# Patient Record
Sex: Male | Born: 1995 | Hispanic: Yes | Marital: Single | Smoking: Never smoker
Health system: Southern US, Community
[De-identification: ages and names within clinical notes are randomized; demographics above are authoritative.]

## PROBLEM LIST (undated history)

## (undated) HISTORY — PX: APPENDECTOMY: SHX54

---

## 2018-05-04 ENCOUNTER — Emergency Department: Payer: No Typology Code available for payment source

## 2018-05-04 ENCOUNTER — Emergency Department
Admission: EM | Admit: 2018-05-04 | Discharge: 2018-05-04 | Disposition: A | Discharge status: Other Acute Inpatient Hospital | Payer: No Typology Code available for payment source | Attending: Student in an Organized Health Care Education/Training Program | Admitting: Student in an Organized Health Care Education/Training Program

## 2018-05-04 ENCOUNTER — Encounter: Payer: Self-pay | Admitting: Emergency Medicine

## 2018-05-04 ENCOUNTER — Other Ambulatory Visit: Payer: Self-pay

## 2018-05-04 DIAGNOSIS — Y939 Activity, unspecified: Secondary | ICD-10-CM | POA: Diagnosis not present

## 2018-05-04 DIAGNOSIS — S27321A Contusion of lung, unilateral, initial encounter: Secondary | ICD-10-CM | POA: Diagnosis not present

## 2018-05-04 DIAGNOSIS — Y929 Unspecified place or not applicable: Secondary | ICD-10-CM | POA: Insufficient documentation

## 2018-05-04 DIAGNOSIS — R002 Palpitations: Secondary | ICD-10-CM | POA: Diagnosis present

## 2018-05-04 DIAGNOSIS — Z79899 Other long term (current) drug therapy: Secondary | ICD-10-CM | POA: Insufficient documentation

## 2018-05-04 DIAGNOSIS — R079 Chest pain, unspecified: Secondary | ICD-10-CM | POA: Insufficient documentation

## 2018-05-04 DIAGNOSIS — S3991XA Unspecified injury of abdomen, initial encounter: Secondary | ICD-10-CM | POA: Diagnosis not present

## 2018-05-04 DIAGNOSIS — Y999 Unspecified external cause status: Secondary | ICD-10-CM | POA: Diagnosis not present

## 2018-05-04 LAB — TYPE AND SCREEN
ABO/RH(D): O POS
ANTIBODY SCREEN: NEGATIVE

## 2018-05-04 LAB — CBC WITH DIFFERENTIAL/PLATELET
Basophils Absolute: 0 10*3/uL (ref 0–0.1)
Basophils Relative: 0 %
EOS PCT: 0 %
Eosinophils Absolute: 0 10*3/uL (ref 0–0.7)
HCT: 41.4 % (ref 40.0–52.0)
Hemoglobin: 14.3 g/dL (ref 13.0–18.0)
LYMPHS ABS: 0.7 10*3/uL — AB (ref 1.0–3.6)
LYMPHS PCT: 4 %
MCH: 30.5 pg (ref 26.0–34.0)
MCHC: 34.6 g/dL (ref 32.0–36.0)
MCV: 88 fL (ref 80.0–100.0)
MONO ABS: 0.8 10*3/uL (ref 0.2–1.0)
Monocytes Relative: 5 %
Neutro Abs: 14.4 10*3/uL — ABNORMAL HIGH (ref 1.4–6.5)
Neutrophils Relative %: 91 %
PLATELETS: 179 10*3/uL (ref 150–440)
RBC: 4.71 MIL/uL (ref 4.40–5.90)
RDW: 14.1 % (ref 11.5–14.5)
WBC: 16 10*3/uL — ABNORMAL HIGH (ref 3.8–10.6)

## 2018-05-04 LAB — BASIC METABOLIC PANEL
Anion gap: 13 (ref 5–15)
BUN: 13 mg/dL (ref 6–20)
CALCIUM: 8.7 mg/dL — AB (ref 8.9–10.3)
CO2: 25 mmol/L (ref 22–32)
Chloride: 102 mmol/L (ref 98–111)
Creatinine, Ser: 0.97 mg/dL (ref 0.61–1.24)
GFR calc Af Amer: >60 mL/min (ref >60)
GLUCOSE: 133 mg/dL — AB (ref 70–99)
POTASSIUM: 4.1 mmol/L (ref 3.5–5.1)
Sodium: 140 mmol/L (ref 135–145)

## 2018-05-04 LAB — FIBRIN DERIVATIVES D-DIMER (ARMC ONLY): Fibrin derivatives D-dimer (ARMC): 922.99 ng/mL (FEU) — ABNORMAL HIGH (ref 0.00–499.00)

## 2018-05-04 LAB — TROPONIN I: Troponin I: <0.03 ng/mL (ref <0.03)

## 2018-05-04 MED ORDER — AMOXICILLIN 500 MG PO TABS: 1000.0000 mg | ORAL_TABLET | Freq: Two times a day (BID) | ORAL | 0 refills | Status: AC

## 2018-05-04 MED ORDER — LORAZEPAM 1 MG PO TABS
1.0000 mg | ORAL_TABLET | Freq: Once | ORAL | Status: AC
  Administered 2018-05-04: 1 mg via ORAL
  Filled 2018-05-04: qty 1

## 2018-05-04 MED ORDER — FAMOTIDINE 20 MG/2ML IV SOLN
20.00 | INTRAVENOUS | Status: DC
Start: 2018-05-05 — End: 2018-05-04

## 2018-05-04 MED ORDER — ONDANSETRON HCL 4 MG/2ML IJ SOLN
4.00 | INTRAMUSCULAR | Status: DC
Start: ? — End: 2018-05-04

## 2018-05-04 MED ORDER — ALBUTEROL SULFATE HFA 108 (90 BASE) MCG/ACT IN AERS: 2.0000 | INHALATION_SPRAY | Freq: Four times a day (QID) | RESPIRATORY_TRACT | 2 refills | Status: AC | PRN

## 2018-05-04 MED ORDER — POTASSIUM CHLORIDE 10 MEQ/100ML IV SOLN
10.00 | INTRAVENOUS | Status: DC
Start: ? — End: 2018-05-04

## 2018-05-04 MED ORDER — SODIUM CHLORIDE 0.9 % IV BOLUS
1000.0000 mL | Freq: Once | INTRAVENOUS | Status: AC
  Administered 2018-05-04: 1000 mL via INTRAVENOUS

## 2018-05-04 MED ORDER — NALOXONE HCL 0.4 MG/ML IJ SOLN
0.40 | INTRAMUSCULAR | Status: DC
Start: ? — End: 2018-05-04

## 2018-05-04 MED ORDER — ALBUTEROL SULFATE (2.5 MG/3ML) 0.083% IN NEBU
2.50 | INHALATION_SOLUTION | RESPIRATORY_TRACT | Status: DC
Start: ? — End: 2018-05-04

## 2018-05-04 MED ORDER — DOCUSATE SODIUM 100 MG PO CAPS
100.00 | ORAL_CAPSULE | ORAL | Status: DC
Start: 2018-05-05 — End: 2018-05-04

## 2018-05-04 MED ORDER — MAGNESIUM SULFATE 2 GM/50ML IV SOLN
2.00 | INTRAVENOUS | Status: DC
Start: ? — End: 2018-05-04

## 2018-05-04 MED ORDER — IOHEXOL 300 MG/ML  SOLN
75.0000 mL | Freq: Once | INTRAMUSCULAR | Status: AC | PRN
  Administered 2018-05-04: 75 mL via INTRAVENOUS

## 2018-05-04 MED ORDER — GENERIC EXTERNAL MEDICATION
1.00 | Status: DC
Start: ? — End: 2018-05-04

## 2018-05-04 MED ORDER — LACTATED RINGERS IV SOLN
100.00 | INTRAVENOUS | Status: DC
Start: ? — End: 2018-05-04

## 2018-05-04 MED ORDER — GENERIC EXTERNAL MEDICATION
15.00 | Status: DC
Start: ? — End: 2018-05-04

## 2018-05-04 MED ORDER — AZITHROMYCIN 250 MG PO TABS: ORAL_TABLET | ORAL | 0 refills | Status: AC

## 2018-05-04 MED ORDER — ACETAMINOPHEN 325 MG PO TABS
650.00 | ORAL_TABLET | ORAL | Status: DC
Start: 2018-05-05 — End: 2018-05-04

## 2018-05-04 MED ORDER — TRANEXAMIC ACID 1000 MG/10ML IV SOLN
1000.0000 mg | Freq: Once | INTRAVENOUS | Status: AC
  Administered 2018-05-04: 1000 mg via INTRAVENOUS
  Filled 2018-05-04: qty 10

## 2018-05-04 MED ORDER — GENERIC EXTERNAL MEDICATION
Status: DC
Start: ? — End: 2018-05-04

## 2018-05-04 MED ORDER — POLYETHYLENE GLYCOL 3350 17 G PO PACK
17.00 | PACK | ORAL | Status: DC
Start: 2018-05-06 — End: 2018-05-04

## 2018-05-04 MED ORDER — IOHEXOL 350 MG/ML SOLN
75.0000 mL | Freq: Once | INTRAVENOUS | Status: AC | PRN
  Administered 2018-05-04: 75 mL via INTRAVENOUS

## 2018-05-04 MED ORDER — TRANEXAMIC ACID 1000 MG/10ML IV SOLN
1000.0000 mg | Freq: Once | INTRAVENOUS | Status: DC
  Filled 2018-05-04: qty 10
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
## 2018-05-04 NOTE — ED Triage Notes (Addendum)
C/O  Palpitations x 3 hours.  States he has chest pain when laying down.  Has had one previous episode of same about 3 years ago, but states "He was able to cool down his heart on his own after 10 minutes"  Patient is AAOx3.  Skin warm and dry. Very anxious.
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
## 2018-05-04 NOTE — ED Notes (Signed)
emtala reviewed by this RN 
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
## 2018-05-05 MED ORDER — ENOXAPARIN SODIUM 30 MG/0.3ML ~~LOC~~ SOLN
30.00 | SUBCUTANEOUS | Status: DC
Start: 2018-05-05 — End: 2018-05-05

## 2018-05-05 MED ORDER — OXYCODONE HCL 5 MG PO TABS
5.00 | ORAL_TABLET | ORAL | Status: DC
Start: ? — End: 2018-05-05

## 2019-03-19 IMAGING — CT CT ANGIO CHEST
2 of 6 series · 19 of 36 positions shown · IV contrast (APPLIED)
Comparison: Current chest radiographs.

CLINICAL DATA: C/O Palpitations x 3 hours. States he has chest pain
when laying down. Has had one previous episode of same about 3 years
ago, but states "He was able to cool down his heart on his own after
10 minutes"

EXAM:
CT ANGIOGRAPHY CHEST WITH CONTRAST
TECHNIQUE: Multidetector CT imaging of the chest was performed using the
standard protocol during bolus administration of intravenous
contrast. Multiplanar CT image reconstructions and MIPs were
obtained to evaluate the vascular anatomy.
CONTRAST:  75mL OMNIPAQUE IOHEXOL 350 MG/ML SOLN

[Series 6: thins · axial · 0.65mm/px · z∈[-28,+248]mm · 18 of 309 slices shown]
[im 16/309  lung]
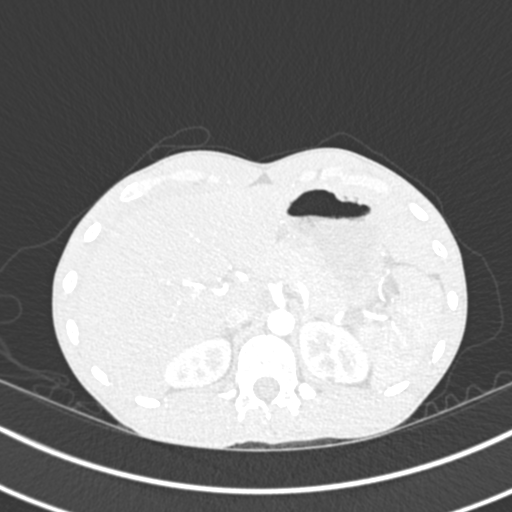
[im 31/309  mediastinal]
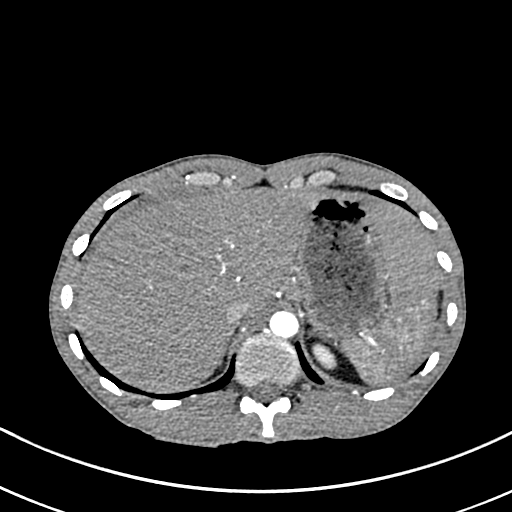
[im 47/309  lung]
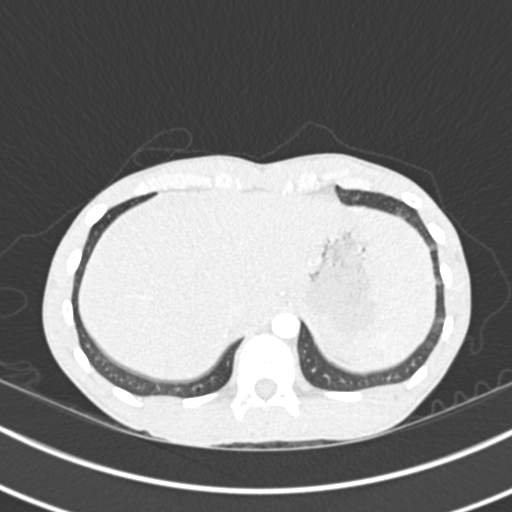
[im 62/309  mediastinal]
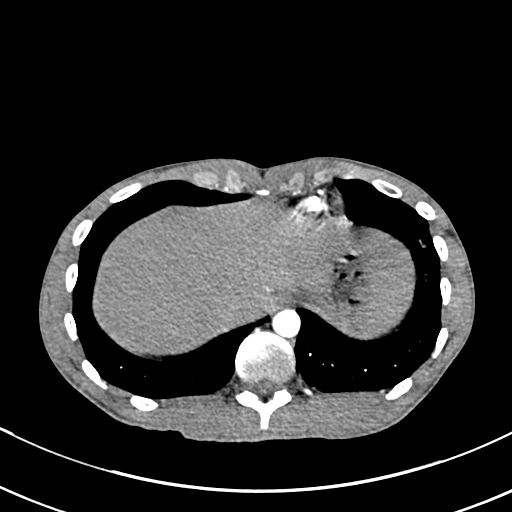
[im 78/309  lung]
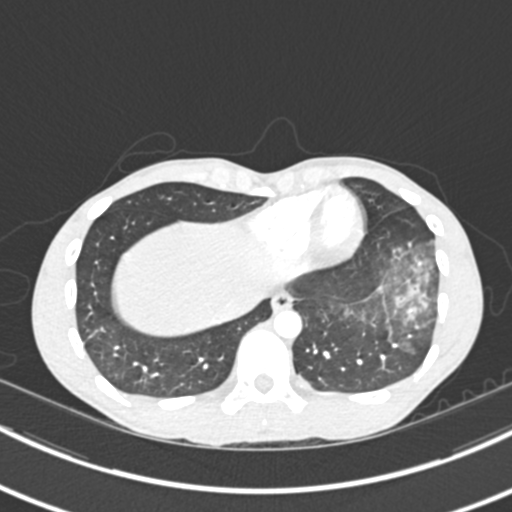
[im 93/309  mediastinal]
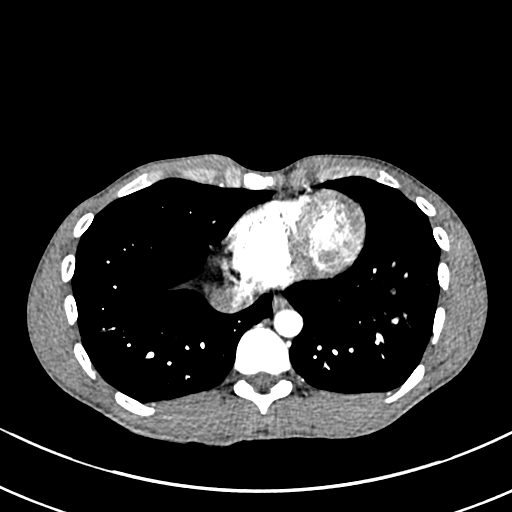
[im 108/309  lung]
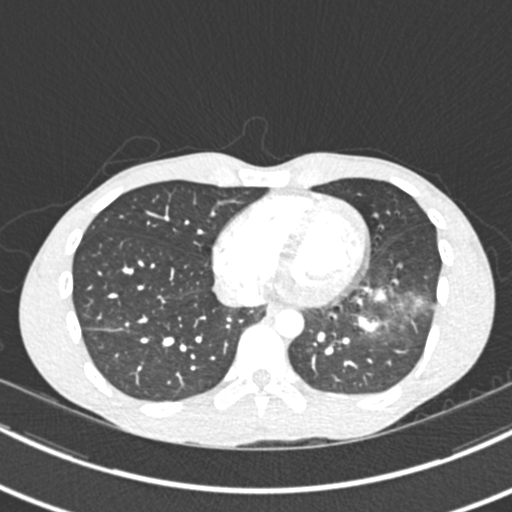
[im 124/309  mediastinal]
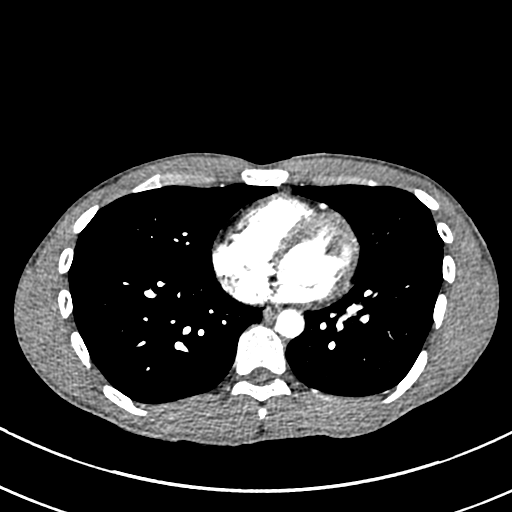
[im 139/309  lung]
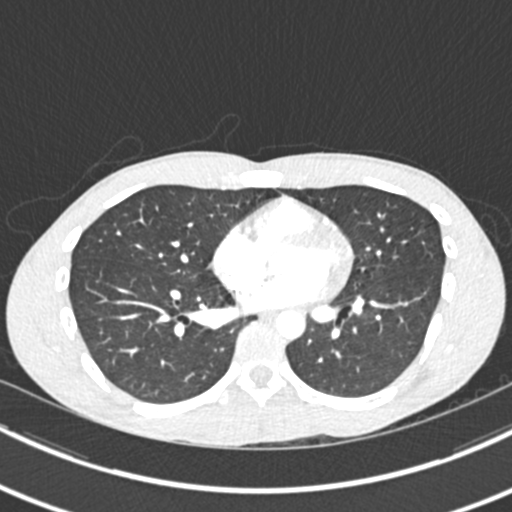
[im 170/309  mediastinal]
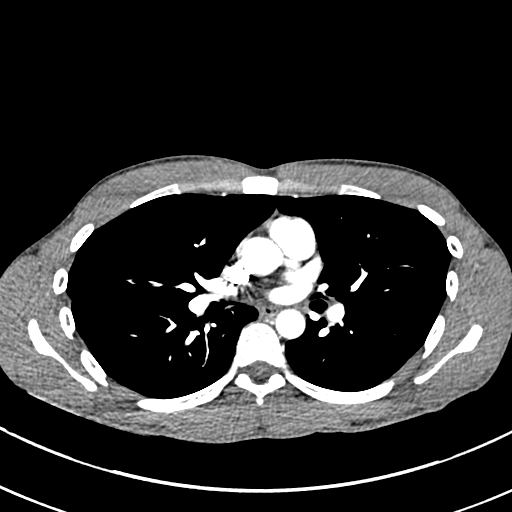
[im 185/309  lung]
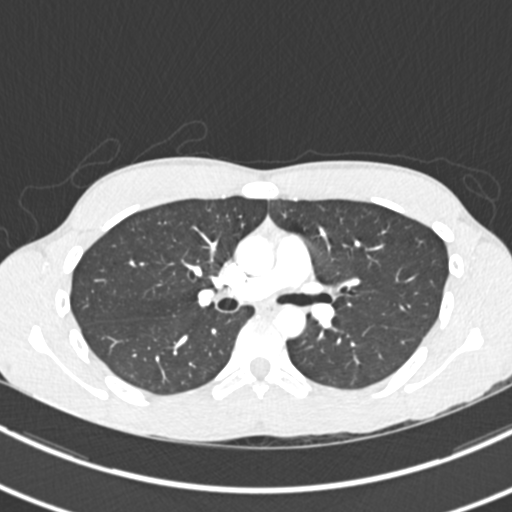
[im 201/309  mediastinal]
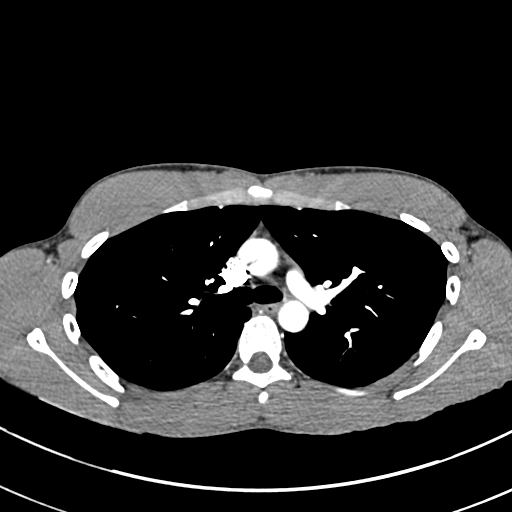
[im 216/309  lung]
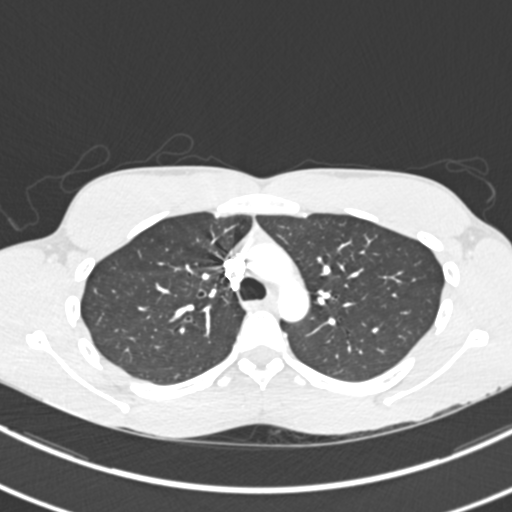
[im 232/309  mediastinal]
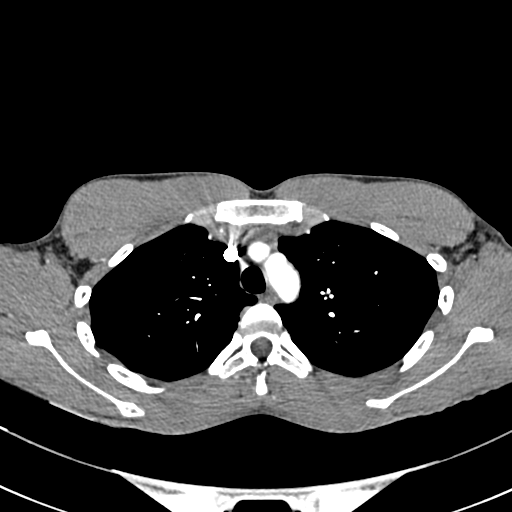
[im 247/309  lung]
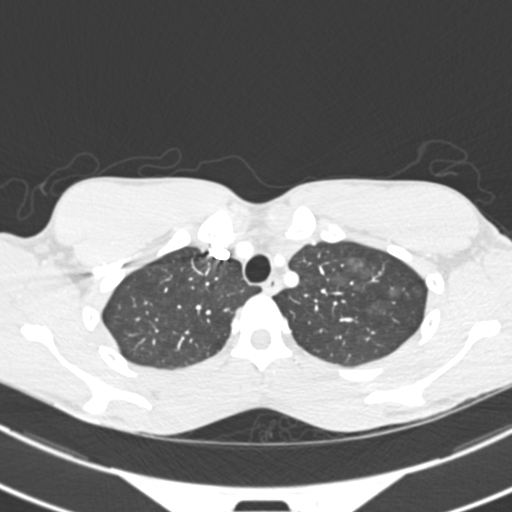
[im 262/309  mediastinal]
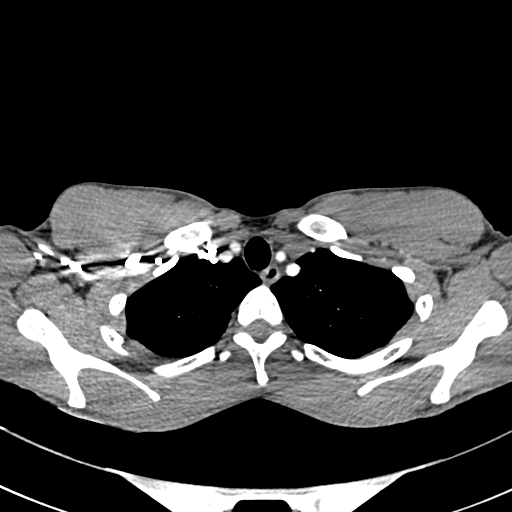
[im 278/309  lung]
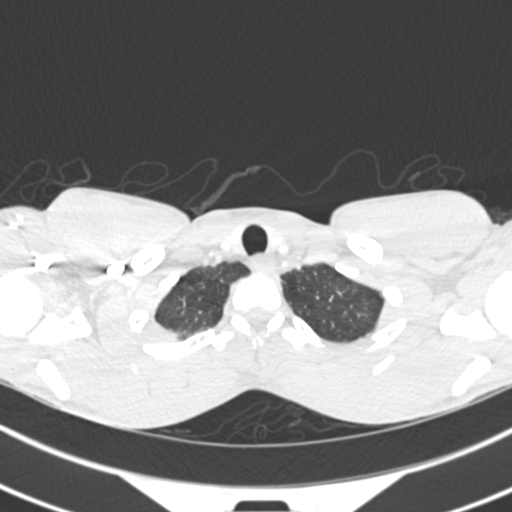
[im 293/309  mediastinal]
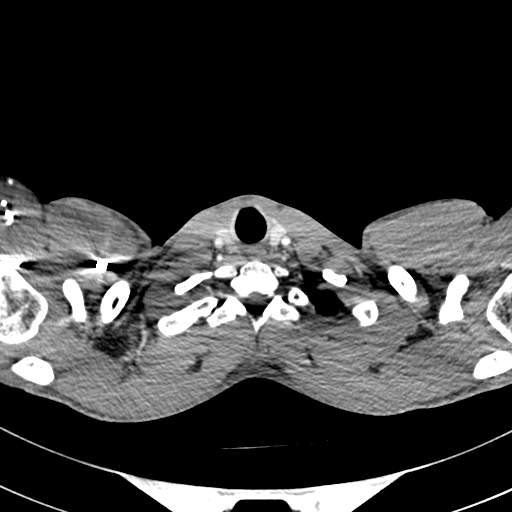

[Series 8: coronal mpr · coronal · 0.63mm/px · 1 of 77 slices shown]
[im 39/77  mediastinal]
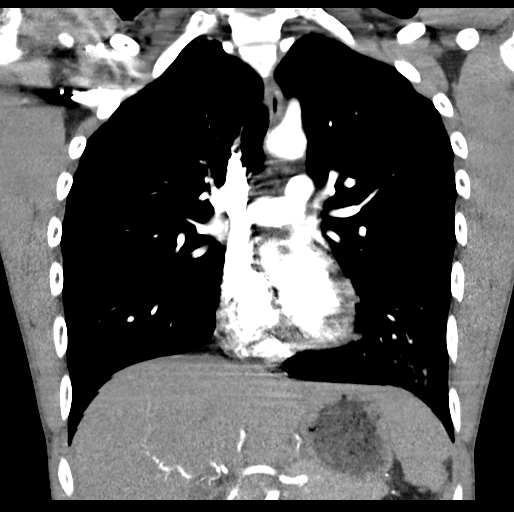

[19 of 36 positions shown; findings below may reference images not displayed]

FINDINGS: Cardiovascular: Satisfactory opacification of the pulmonary arteries
to the segmental level. No evidence of pulmonary embolism. Normal
heart size. No pericardial effusion. Great vessels normal in
caliber. No aortic dissection or atherosclerosis.

Mediastinum/Nodes: No enlarged mediastinal, hilar, or axillary lymph
nodes. Thyroid gland, trachea, and esophagus demonstrate no
significant findings.

Lungs/Pleura: Peribronchovascular ground-glass and more dense
consolidation in the left lower lobe. Subtle peribronchovascular
ground-glass opacities in the left upper lobe. Remainder of the
lungs is clear. No pleural effusion or pneumothorax.

Upper Abdomen: Small amount of ascites lies adjacent to the liver.
No other abnormality on the limited upper abdomen field of view.

Musculoskeletal: No chest wall abnormality. No acute or significant
osseous findings.

Review of the MIP images confirms the above findings.
IMPRESSION: 1. No evidence of a pulmonary embolism.
2. Airspace consolidation in the left lower lobe consistent with
pneumonia. There is more subtle ground-glass opacity in the left
upper lobe also consistent with infection.
3. Small amount of ascites adjacent to the liver of unclear
etiology.

## 2019-03-19 IMAGING — CT CT HEAD W/O CM
3 series · 15 of 47 positions shown, 18 images · non-contrast
Comparison: None.

CLINICAL DATA: C/O Palpitations x 3 hours. States he has chest pain
when laying down. Has had one previous episode of same about 3 years
ago, but states "He was able to cool down his heart on his own after
10 minutes"

EXAM:
CT HEAD WITHOUT CONTRAST
TECHNIQUE: Contiguous axial images were obtained from the base of the skull
through the vertex without intravenous contrast.

[Series 2: head wo · axial · 0.39mm/px · z∈[+384,+509]mm · 9 of 30 slices shown, 12 images]
[im 3/30  brain]
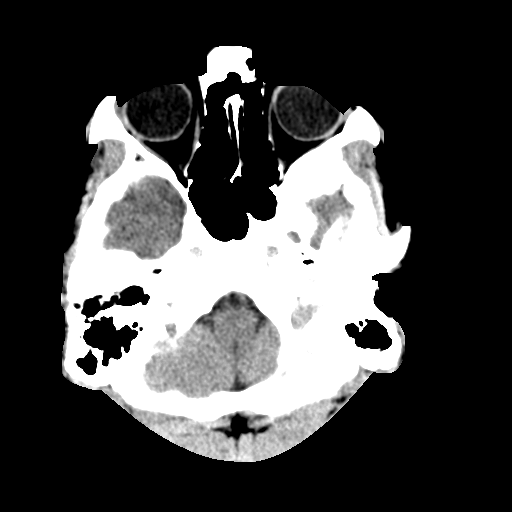
[im 3/30  bone]
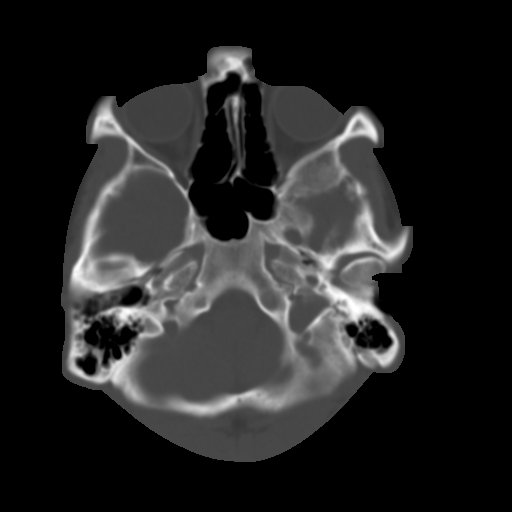
[im 6/30  brain]
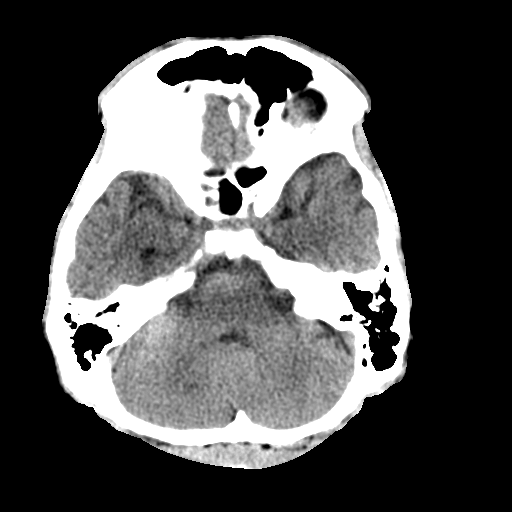
[im 9/30  brain]
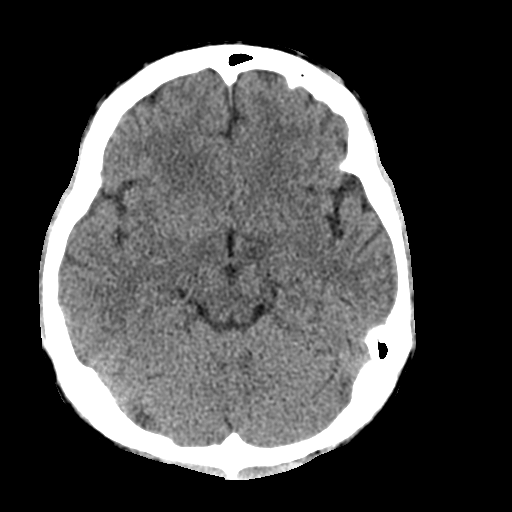
[im 12/30  brain]
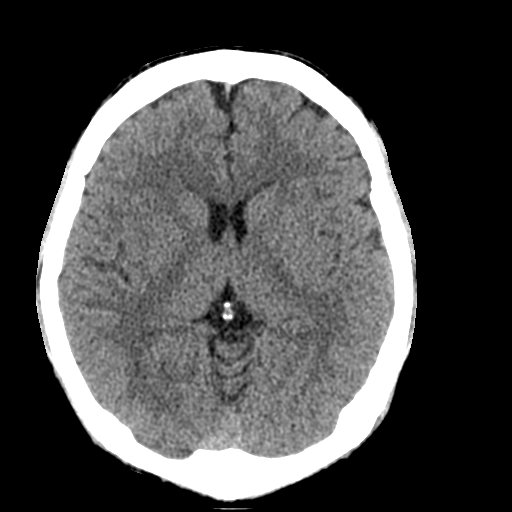
[im 16/30  brain]
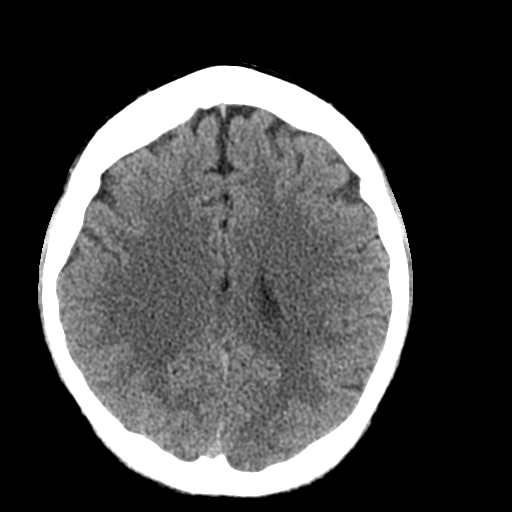
[im 16/30  bone]
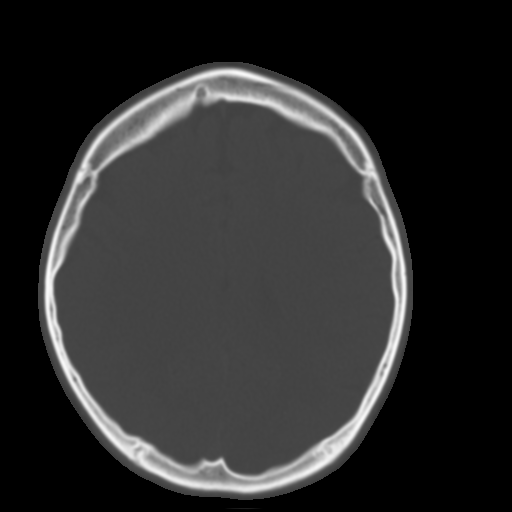
[im 19/30  brain]
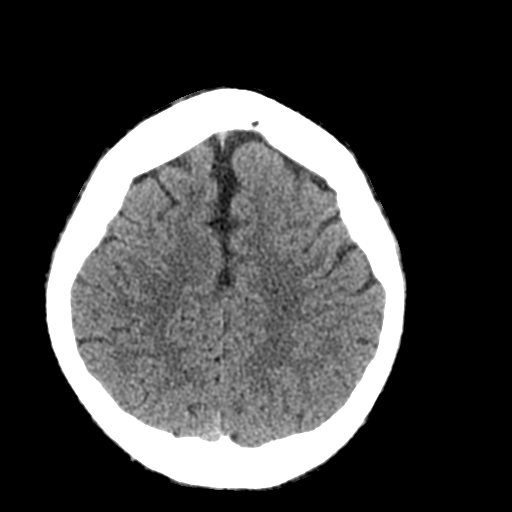
[im 22/30  brain]
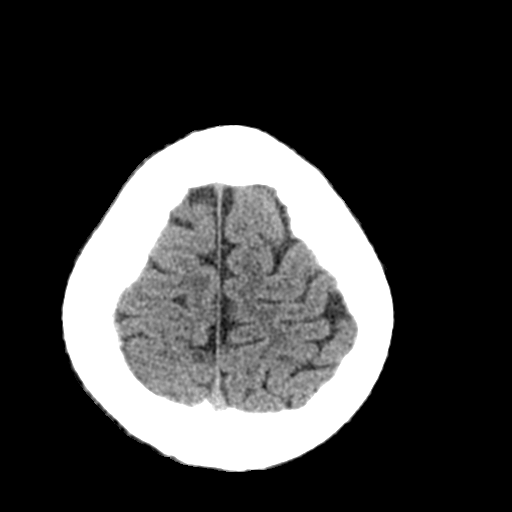
[im 25/30  brain]
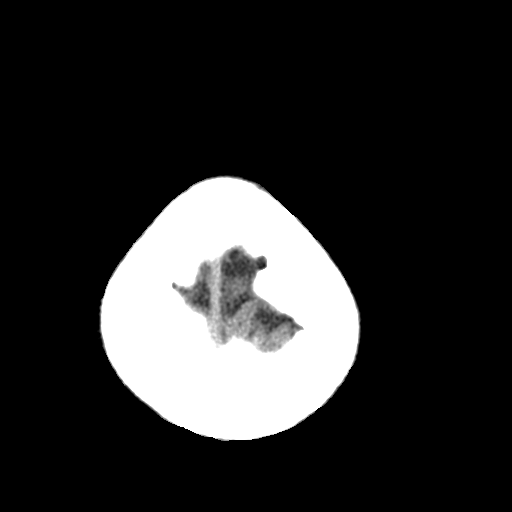
[im 28/30  brain]
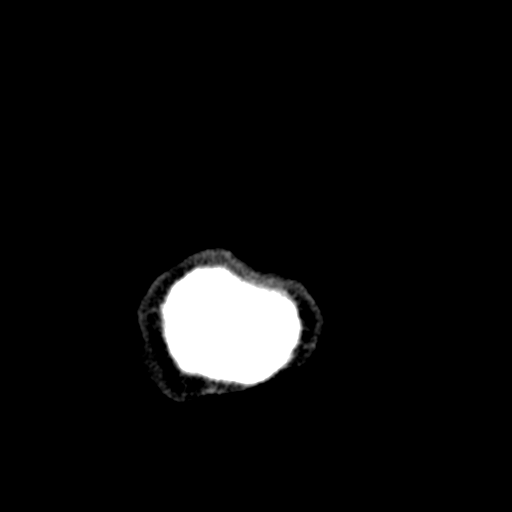
[im 28/30  bone]
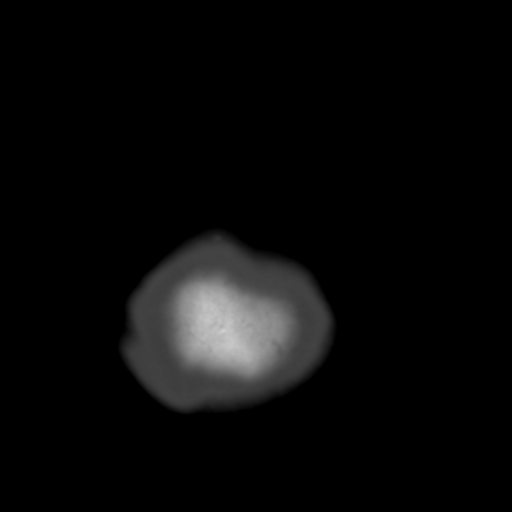

[Series 4: coronal soft tissue · coronal · 0.29mm/px · 3 of 62 slices shown]
[im 21/62  brain]
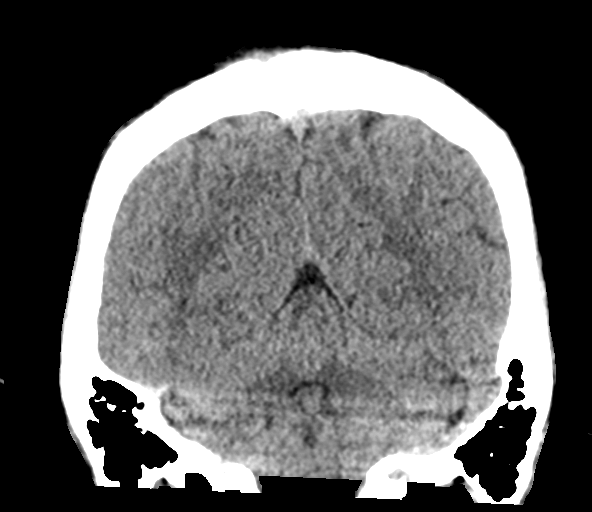
[im 28/62  brain]
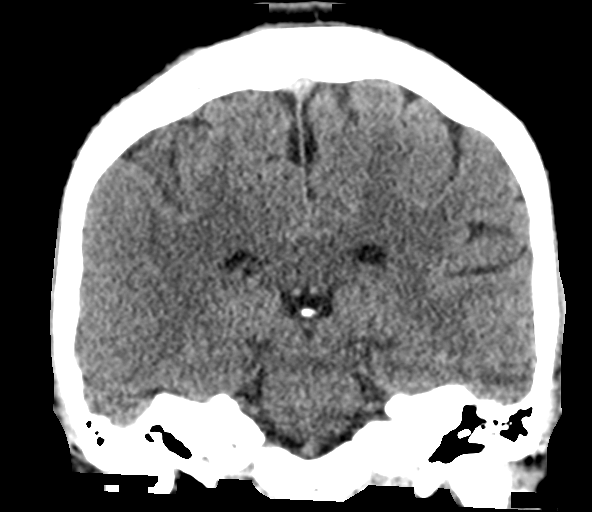
[im 34/62  brain]
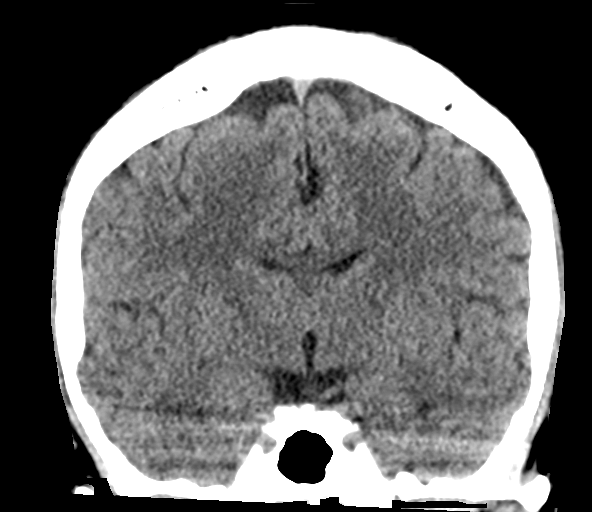

[Series 5: sagittal soft tissue · sagittal · 0.29mm/px · 3 of 55 slices shown]
[im 19/55  brain]
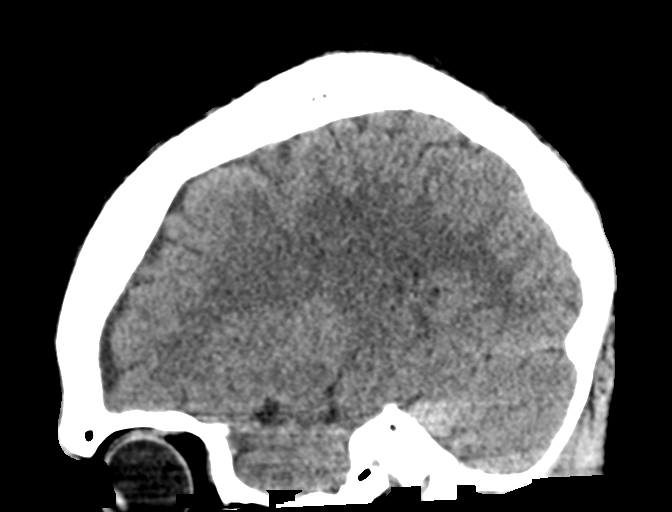
[im 28/55  brain]
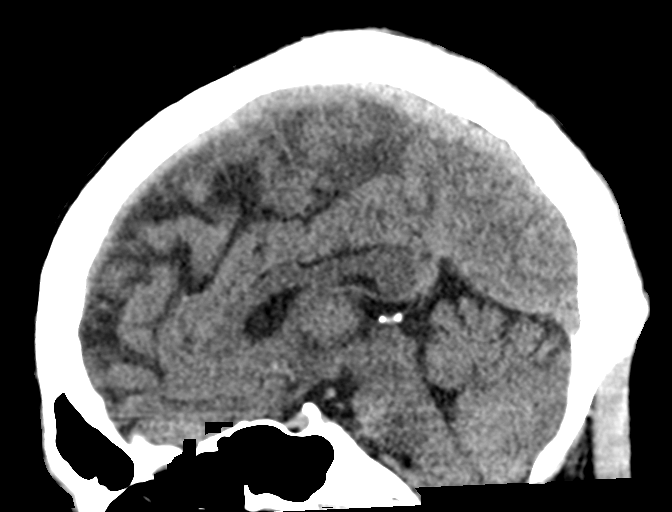
[im 37/55  brain]
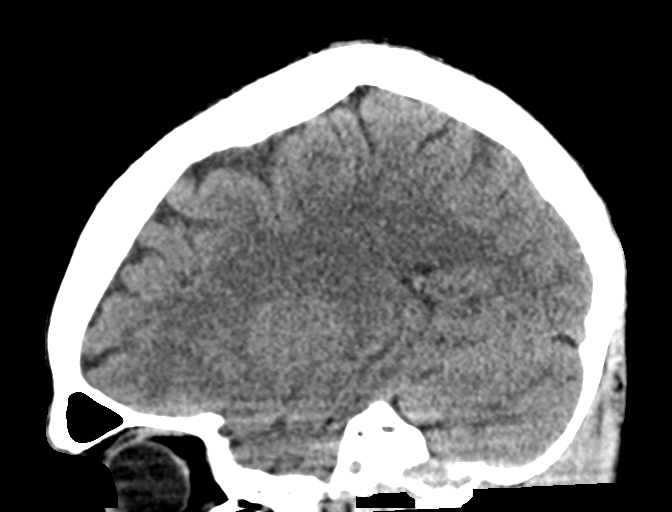

[15 of 47 positions shown; findings below may reference images not displayed]

FINDINGS: Brain: No evidence of acute infarction, hemorrhage, hydrocephalus,
extra-axial collection or mass lesion/mass effect.

Vascular: No hyperdense vessel or unexpected calcification.

Skull: Normal. Negative for fracture or focal lesion.

Sinuses/Orbits: Visualize globes and orbits are unremarkable.
Visualized sinuses and mastoid air cells are clear.

Other: None.
IMPRESSION: Normal unenhanced CT scan of the brain.
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
